# Patient Record
Sex: Male | Born: 1975 | Race: White | Hispanic: No | Marital: Married
Health system: Southern US, Community
[De-identification: ages and names within clinical notes are randomized; demographics above are authoritative.]

## PROBLEM LIST (undated history)

## (undated) DIAGNOSIS — I509 Heart failure, unspecified: Secondary | ICD-10-CM

---

## 2017-02-09 ENCOUNTER — Encounter: Payer: Self-pay | Admitting: Emergency Medicine

## 2017-02-09 ENCOUNTER — Observation Stay
Admit: 2017-02-09 | Discharge: 2017-02-09 | Disposition: A | Discharge status: Home / Self Care | Payer: Commercial Managed Care - PPO | Attending: Internal Medicine | Admitting: Internal Medicine

## 2017-02-09 ENCOUNTER — Observation Stay
Admission: EM | Admit: 2017-02-09 | Discharge: 2017-02-10 | Disposition: A | Discharge status: Home / Self Care | Payer: Commercial Managed Care - PPO | Attending: Internal Medicine | Admitting: Internal Medicine

## 2017-02-09 ENCOUNTER — Emergency Department: Payer: Commercial Managed Care - PPO

## 2017-02-09 ENCOUNTER — Other Ambulatory Visit: Payer: Self-pay

## 2017-02-09 DIAGNOSIS — I340 Nonrheumatic mitral (valve) insufficiency: Secondary | ICD-10-CM | POA: Diagnosis not present

## 2017-02-09 DIAGNOSIS — I4891 Unspecified atrial fibrillation: Principal | ICD-10-CM | POA: Insufficient documentation

## 2017-02-09 DIAGNOSIS — I5022 Chronic systolic (congestive) heart failure: Secondary | ICD-10-CM | POA: Insufficient documentation

## 2017-02-09 DIAGNOSIS — E669 Obesity, unspecified: Secondary | ICD-10-CM | POA: Insufficient documentation

## 2017-02-09 DIAGNOSIS — I110 Hypertensive heart disease with heart failure: Secondary | ICD-10-CM | POA: Insufficient documentation

## 2017-02-09 DIAGNOSIS — E782 Mixed hyperlipidemia: Secondary | ICD-10-CM | POA: Insufficient documentation

## 2017-02-09 DIAGNOSIS — Z79899 Other long term (current) drug therapy: Secondary | ICD-10-CM | POA: Diagnosis not present

## 2017-02-09 DIAGNOSIS — I426 Alcoholic cardiomyopathy: Secondary | ICD-10-CM | POA: Diagnosis not present

## 2017-02-09 DIAGNOSIS — G4733 Obstructive sleep apnea (adult) (pediatric): Secondary | ICD-10-CM | POA: Diagnosis not present

## 2017-02-09 DIAGNOSIS — Z6841 Body Mass Index (BMI) 40.0 and over, adult: Secondary | ICD-10-CM | POA: Diagnosis not present

## 2017-02-09 DIAGNOSIS — I420 Dilated cardiomyopathy: Secondary | ICD-10-CM | POA: Diagnosis not present

## 2017-02-09 DIAGNOSIS — R079 Chest pain, unspecified: Secondary | ICD-10-CM | POA: Diagnosis present

## 2017-02-09 DIAGNOSIS — I959 Hypotension, unspecified: Secondary | ICD-10-CM | POA: Insufficient documentation

## 2017-02-09 DIAGNOSIS — I493 Ventricular premature depolarization: Secondary | ICD-10-CM | POA: Insufficient documentation

## 2017-02-09 HISTORY — DX: Heart failure, unspecified: I50.9

## 2017-02-09 LAB — CBC
HCT: 46.5 % (ref 40.0–52.0)
Hemoglobin: 15.7 g/dL (ref 13.0–18.0)
MCH: 29 pg (ref 26.0–34.0)
MCHC: 33.6 g/dL (ref 32.0–36.0)
MCV: 86.2 fL (ref 80.0–100.0)
PLATELETS: 297 10*3/uL (ref 150–440)
RBC: 5.4 MIL/uL (ref 4.40–5.90)
RDW: 13.2 % (ref 11.5–14.5)
WBC: 6.8 10*3/uL (ref 3.8–10.6)

## 2017-02-09 LAB — COMPREHENSIVE METABOLIC PANEL
ALT: 27 U/L (ref 17–63)
ANION GAP: 9 (ref 5–15)
AST: 26 U/L (ref 15–41)
Albumin: 4.6 g/dL (ref 3.5–5.0)
Alkaline Phosphatase: 71 U/L (ref 38–126)
BUN: 18 mg/dL (ref 6–20)
CHLORIDE: 106 mmol/L (ref 101–111)
CO2: 25 mmol/L (ref 22–32)
CREATININE: 0.73 mg/dL (ref 0.61–1.24)
Calcium: 9.8 mg/dL (ref 8.9–10.3)
Glucose, Bld: 98 mg/dL (ref 65–99)
Potassium: 4 mmol/L (ref 3.5–5.1)
SODIUM: 140 mmol/L (ref 135–145)
Total Bilirubin: 1.3 mg/dL — ABNORMAL HIGH (ref 0.3–1.2)
Total Protein: 8 g/dL (ref 6.5–8.1)

## 2017-02-09 LAB — URINE DRUG SCREEN, QUALITATIVE (ARMC ONLY)
Amphetamines, Ur Screen: NOT DETECTED
BARBITURATES, UR SCREEN: NOT DETECTED
Benzodiazepine, Ur Scrn: NOT DETECTED
Cannabinoid 50 Ng, Ur ~~LOC~~: NOT DETECTED
Cocaine Metabolite,Ur ~~LOC~~: NOT DETECTED
MDMA (ECSTASY) UR SCREEN: NOT DETECTED
METHADONE SCREEN, URINE: NOT DETECTED
Opiate, Ur Screen: NOT DETECTED
Phencyclidine (PCP) Ur S: NOT DETECTED
TRICYCLIC, UR SCREEN: NOT DETECTED

## 2017-02-09 LAB — TROPONIN I: Troponin I: <0.03 ng/mL (ref <0.03)

## 2017-02-09 LAB — ETHANOL

## 2017-02-09 LAB — TSH: TSH: 0.925 u[IU]/mL (ref 0.350–4.500)

## 2017-02-09 LAB — APTT: APTT: 28 s (ref 24–36)

## 2017-02-09 LAB — BRAIN NATRIURETIC PEPTIDE: B NATRIURETIC PEPTIDE 5: 193 pg/mL — AB (ref 0.0–100.0)

## 2017-02-09 MED ORDER — ENOXAPARIN SODIUM 40 MG/0.4ML ~~LOC~~ SOLN
40.0000 mg | SUBCUTANEOUS | Status: DC
  Administered 2017-02-09: 40 mg via SUBCUTANEOUS
  Filled 2017-02-09: qty 0.4

## 2017-02-09 MED ORDER — ESCITALOPRAM OXALATE 10 MG PO TABS
20.0000 mg | ORAL_TABLET | Freq: Every day | ORAL | Status: DC
  Administered 2017-02-09: 20 mg via ORAL
  Filled 2017-02-09: qty 1

## 2017-02-09 MED ORDER — SODIUM CHLORIDE 0.9 % IV SOLN
Freq: Once | INTRAVENOUS | Status: AC
  Administered 2017-02-09: 08:00:00 via INTRAVENOUS

## 2017-02-09 MED ORDER — ETODOLAC 200 MG PO CAPS
400.0000 mg | ORAL_CAPSULE | Freq: Two times a day (BID) | ORAL | Status: DC
  Administered 2017-02-09 – 2017-02-10 (×3): 400 mg via ORAL
  Filled 2017-02-09 (×3): qty 2

## 2017-02-09 MED ORDER — ONDANSETRON HCL 4 MG/2ML IJ SOLN: 4.0000 mg | Freq: Four times a day (QID) | INTRAMUSCULAR | Status: DC | PRN

## 2017-02-09 MED ORDER — ACETAMINOPHEN 325 MG PO TABS: 650.0000 mg | ORAL_TABLET | ORAL | Status: DC | PRN

## 2017-02-09 MED ORDER — DILTIAZEM HCL 25 MG/5ML IV SOLN
20.0000 mg | Freq: Once | INTRAVENOUS | Status: AC
  Administered 2017-02-09: 20 mg via INTRAVENOUS
  Filled 2017-02-09: qty 5

## 2017-02-09 MED ORDER — NITROGLYCERIN 0.4 MG SL SUBL: 0.4000 mg | SUBLINGUAL_TABLET | SUBLINGUAL | Status: DC | PRN

## 2017-02-09 MED ORDER — ASPIRIN EC 325 MG PO TBEC
325.0000 mg | DELAYED_RELEASE_TABLET | Freq: Every day | ORAL | Status: DC
  Administered 2017-02-09 – 2017-02-10 (×2): 325 mg via ORAL
  Filled 2017-02-09 (×2): qty 1

## 2017-02-09 MED ORDER — PERFLUTREN LIPID MICROSPHERE
1.0000 mL | INTRAVENOUS | Status: AC | PRN
  Administered 2017-02-09: 1 mL via INTRAVENOUS
  Filled 2017-02-09: qty 2

## 2017-02-09 MED ORDER — SODIUM CHLORIDE 0.9 % IV SOLN
INTRAVENOUS | Status: DC
  Administered 2017-02-09 – 2017-02-10 (×2): via INTRAVENOUS

## 2017-02-09 MED ORDER — ASPIRIN 81 MG PO CHEW
324.0000 mg | CHEWABLE_TABLET | Freq: Once | ORAL | Status: AC
  Administered 2017-02-09: 324 mg via ORAL
  Filled 2017-02-09: qty 4

## 2017-02-09 NOTE — ED Triage Notes (Signed)
Pt to ed via ems from with reports of chest pain, diaphoresis started this morning. Also reports shortness of breath.

## 2017-02-09 NOTE — ED Provider Notes (Signed)
[facility] Emergency Department Provider Note       Time seen: ----------------------------------------- 7:35 AM on 02/09/2017 -----------------------------------------   I have reviewed the triage vital signs and the nursing notes.  HISTORY   Chief Complaint Chest Pain; Shortness of Breath; and Dizziness    HPI Louis Nelson is a 42 y.o. male with a history of CHF who presents to the ED for chest pain and diaphoresis that started this morning.  He also reports shortness of breath.  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  He was found to be in atrial fibrillation on arrival here.  He has never had a history of arrhythmia in the past.  Patient reports he had some sinus symptoms last week and had taken antibiotics but is not currently taking any over-the-counter medicines.  He denies any alcohol use.  Past Medical History:  Diagnosis Date  . CHF (congestive heart failure) (HCC)     There are no active problems to display for this patient.   Allergies Patient has no known allergies.  Social History Social History   Tobacco Use  . Smoking status: Not on file  Substance Use Topics  . Alcohol use: Not on file  . Drug use: Not on file   Review of Systems Constitutional: Negative for fever. Eyes: Negative for vision changes ENT: Positive for recent congestion Cardiovascular: Positive for chest pain Respiratory: Positive for shortness of breath Gastrointestinal: Negative for abdominal pain, vomiting and diarrhea. Genitourinary: Negative for dysuria. Musculoskeletal: Negative for back pain. Skin: Positive for diaphoresis Neurological: Negative for headaches, focal weakness or numbness.  All systems negative/normal/unremarkable except as stated in the HPI  ____________________________________________   PHYSICAL EXAM:  VITAL SIGNS: ED Triage Vitals [02/09/17 0719]  Enc Vitals Group     BP  125/70     Pulse Rate (!) 125     Resp (!) 22     Temp 98.7 F (37.1 C)     Temp Source Oral     SpO2 92 %     Weight 279 lb (126.6 kg)     Height      Head Circumference      Peak Flow      Pain Score 4     Pain Loc      Pain Edu?      Excl. in GC?    Constitutional: Alert and oriented. Well appearing and in no distress. Eyes: Conjunctivae are normal. Normal extraocular movements. ENT   Head: Normocephalic and atraumatic.   Nose: No congestion/rhinnorhea.   Mouth/Throat: Mucous membranes are moist.   Neck: No stridor. Cardiovascular: Rapid rate, irregular rhythm. No murmurs, rubs, or gallops. Respiratory: Normal respiratory effort without tachypnea nor retractions. Breath sounds are clear and equal bilaterally. No wheezes/rales/rhonchi. Gastrointestinal: Soft and nontender. Normal bowel sounds Musculoskeletal: Nontender with normal range of motion in extremities. No lower extremity tenderness nor edema. Neurologic:  Normal speech and language. No gross focal neurologic deficits are appreciated.  Skin:  Skin is warm, dry and intact. No rash noted. Psychiatric: Mood and affect are normal. Speech and behavior are normal.  ____________________________________________  EKG: Interpreted by me.  Atrial fibrillation with a rapid ventricular response, rate is 125 bpm, normal QRS size, normal QT.  ____________________________________________  ED COURSE:  As part of my medical decision making, I reviewed the following data within the electronic MEDICAL RECORD NUMBER History obtained from family if available, nursing notes, old chart  and ekg, as well as notes from prior ED visits. Patient presented for chest pain and was found to be in new onset atrial fibrillation, we will assess with labs and imaging as indicated at this time.   Procedures ____________________________________________   LABS (pertinent positives/negatives)  Labs Reviewed  COMPREHENSIVE METABOLIC PANEL -  Abnormal; Notable for the following components:      Result Value   Total Bilirubin 1.3 (*)    All other components within normal limits  CBC  TROPONIN I  TSH  URINE DRUG SCREEN, QUALITATIVE ([facility] ONLY)  BRAIN NATRIURETIC PEPTIDE  APTT   CRITICAL CARE Performed by: Emily FilbertWilliams, Elye Harmsen E   Total critical care time: 30 minutes  Critical care time was exclusive of separately billable procedures and treating other patients.  Critical care was necessary to treat or prevent imminent or life-threatening deterioration.  Critical care was time spent personally by me on the following activities: development of treatment plan with patient and/or surrogate as well as nursing, discussions with consultants, evaluation of patient's response to treatment, examination of patient, obtaining history from patient or surrogate, ordering and performing treatments and interventions, ordering and review of laboratory studies, ordering and review of radiographic studies, pulse oximetry and re-evaluation of patient's condition.  RADIOLOGY Images were viewed by me  Chest x-ray  IMPRESSION: No active disease. ____________________________________________  DIFFERENTIAL DIAGNOSIS   A. fib, MI, unstable angina, PE, pneumothorax, CHF  FINAL ASSESSMENT AND PLAN  Atrial fibrillation with a rapid ventricular response, chest pain   Plan: Patient had presented for chest pain and found to be in new onset A. fib. Patient's labs are reassuring. Patient's imaging were also reassuring.  No clear etiology for his sudden onset of chest pain and diaphoresis.  He will likely need to be admitted for new onset A. fib as well as this chest pain today.  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  He will likely need an echocardiogram.  Currently he is rate controlled.   Emily FilbertWilliams, Brendin Situ E, MD   Note: This note was generated in part or whole with voice recognition  software. Voice recognition is usually quite accurate but there are transcription errors that can and very often do occur. I apologize for any typographical errors that were not detected and corrected.     Emily FilbertWilliams, Alanee Ting E, MD 02/09/17 [phone]

## 2017-02-09 NOTE — H&P (Signed)
[facility] - [place] at [facility]   PATIENT NAME: Louis Nelson    MR#:  [id number]  DATE OF BIRTH:  [birth year]  DATE OF ADMISSION:  02/09/2017  PRIMARY CARE PHYSICIAN: Patient, No Pcp Per   REQUESTING/REFERRING PHYSICIAN: Emily Filbert, MD  CHIEF COMPLAINT:  Chest pain, shortness of breath and dizziness  HISTORY OF PRESENT ILLNESS:  Louis Nelson  is a 42 y.o. male with a known history of congestive heart failure, alcoholic cardiomyopathy, works as a Naval architect to deliver milk, is presenting to the ED with a chief complaint of chest pain with diaphoresis, shortness of breath and dizziness started this morning.  Patient came into the ED, EKG has revealed A. fib with RVR.  Reports last alcohol intake was around Christmastime.  Patient was given 20 mg of IV Cardizem and during my examination rate was controlled though he was still in atrial fibrillation.  Patient was  reporting chest pain in left axillary area,  hurts with movements.  Wife at bedside  PAST MEDICAL HISTORY:   Past Medical History:  Diagnosis Date  . CHF (congestive heart failure) (HCC)     PAST SURGICAL HISTOIRY:  Tonsillectomy and lymph node dissection  SOCIAL HISTORY:   Social History   Tobacco Use  . Smoking status: Not on file  Substance Use Topics  . Alcohol use: Not on file    FAMILY HISTORY:  No family history on file.  DRUG ALLERGIES:  No Known Allergies  REVIEW OF SYSTEMS:  CONSTITUTIONAL: No fever, fatigue or weakness.  EYES: No blurred or double vision.  EARS, NOSE, AND THROAT: No tinnitus or ear pain.  RESPIRATORY: No cough, shortness of breath, wheezing or hemoptysis.  CARDIOVASCULAR: Reporting left axillary chest pain, denies orthopnea, edema.  GASTROINTESTINAL: No nausea, vomiting, diarrhea or abdominal pain.  GENITOURINARY: No dysuria, hematuria.  ENDOCRINE: No polyuria, nocturia,  HEMATOLOGY: No anemia, easy bruising or bleeding SKIN: No rash or  lesion. MUSCULOSKELETAL: No joint pain or arthritis.   NEUROLOGIC: No tingling, numbness, weakness.  PSYCHIATRY: No anxiety or depression.   MEDICATIONS AT HOME:   Prior to Admission medications   Medication Sig Start Date End Date Taking? Authorizing Provider  carvedilol (COREG) 6.25 MG tablet Take 6.25 mg by mouth 2 (two) times daily with a meal.   Yes [provider]  escitalopram (LEXAPRO) 20 MG tablet Take 20 mg by mouth at bedtime.   Yes [provider]  etodolac (LODINE) 400 MG tablet Take 400 mg by mouth 2 (two) times daily.   Yes [provider]  furosemide (LASIX) 20 MG tablet Take 20 mg by mouth daily.   Yes [provider]  losartan (COZAAR) 50 MG tablet Take 50 mg by mouth daily.   Yes [provider]      VITAL SIGNS:  Blood pressure (!) 96/53, pulse 82, temperature 98.7 F (37.1 C), temperature source Oral, resp. rate 17, weight 126.6 kg (279 lb), SpO2 97 %.  PHYSICAL EXAMINATION:  GENERAL:  42 y.o.-year-old patient lying in the bed with no acute distress.  EYES: Pupils equal, round, reactive to light and accommodation. No scleral icterus. Extraocular muscles intact.  HEENT: Head atraumatic, normocephalic. Oropharynx and nasopharynx clear.  NECK:  Supple, no jugular venous distention. No thyroid enlargement, no tenderness.  LUNGS: Normal breath sounds bilaterally, no wheezing, rales,rhonchi or crepitation. No use of accessory muscles of respiration.  CARDIOVASCULAR: S1, S2 normal. No murmurs, rubs, or gallops.  Left axillary area  with  reproducible tenderness ABDOMEN: Soft, nontender, nondistended. Bowel sounds present. No organomegaly or mass.  EXTREMITIES: No pedal edema, cyanosis, or clubbing.  NEUROLOGIC: Cranial nerves II through XII are intact. Muscle strength 5/5 in all extremities. Sensation intact. Gait not checked.  PSYCHIATRIC: The patient is alert and oriented x 3.  SKIN: No obvious rash, lesion, or ulcer.    LABORATORY PANEL:   CBC Recent Labs  Lab 02/09/17 0735  WBC 6.8  HGB 15.7  HCT 46.5  PLT 297   ------------------------------------------------------------------------------------------------------------------  Chemistries  Recent Labs  Lab 02/09/17 0735  NA 140  K 4.0  CL 106  CO2 25  GLUCOSE 98  BUN 18  CREATININE 0.73  CALCIUM 9.8  AST 26  ALT 27  ALKPHOS 71  BILITOT 1.3*   ------------------------------------------------------------------------------------------------------------------  Cardiac Enzymes Recent Labs  Lab 02/09/17 0735  TROPONINI <0.03   ------------------------------------------------------------------------------------------------------------------  RADIOLOGY:  Dg Chest Port 1 View  Result Date: 02/09/2017 CLINICAL DATA:  Chest pain EXAM: PORTABLE CHEST 1 VIEW COMPARISON:  None. FINDINGS: Cardiomegaly. No focal opacities or effusions. No acute bony abnormality. IMPRESSION: No active disease. Electronically Signed   By: Charlett NoseKevin  Dover M.D.   On: 02/09/2017 08:23    EKG:   Orders placed or performed during the hospital encounter of 02/09/17  . EKG 12-Lead  . EKG 12-Lead  . ED EKG  . ED EKG    IMPRESSION AND PLAN:   Louis Nelson  is a 42 y.o. 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  Patient came into the ED, EKG has revealed A. fib with RVR.  Reports last alcohol intake was around Christmastime.  # New onset atrial fib with RVR Admit to tele Patient has received Cardizem 20 mg IV in the ED, currently rate controlled and hypotensive  [place] score is 2  We will start him on aspirin 325 mg and defer NOAC to cardiologist We will get an echocardiogram Hydrate with IV fluids  TSH is normal, chest x-ray negative Consult  cardiology-[facility](unassigned  urine drug screen negative  #Chest pain- Seems to be musculoskeletal as chest pain is reproducible  we will recycle cardiac biomarkers Monitor patient on telemetry Get echocardiogram Nitroglycerin sublingual as needed Cardiology consulted patient's primary cardiologist [place] in [place].   #History of alcoholic cardiomyopathy Will check serum alcohol level Patient being hypotensive will hold off on patient's home medication Coreg, Cozaar, Lasix at this point Patient reports his last drink was 2 weeks ago ciwa  #Chronic systolic class II CHF Currently not fluid overloaded Monitor daily weights, intake and output Continue aspirin Hold Lasix , Coreg and Cozaar in view of hypotension  #Essential hypertension Currently patient is hypotensive.  Hold off Coreg, Cozaar and Lasix  #Obesity Patient needs lifestyle modifications once clinically stable  DVT prophylaxis with Lovenox subcu   All the records are reviewed and case discussed with ED provider. Management plans discussed with the patient, family and they are in agreement.  CODE STATUS: fc , wife HCPOA  TOTAL TIME TAKING CARE OF THIS PATIENT: 43 minutes.   Note: This dictation was prepared with Dragon dictation along with smaller phrase technology. Any transcriptional errors that result from this process are unintentional.  Ramonita LabGouru, Quran Vasco M.D on 02/09/2017 at 11:00 AM  Between 7am to 6pm - Pager - [phone]  After 6pm go to [email or web address] - password [email or web address]  [facility]  Office  [phone]  CC: Primary care physician; Patient, No Pcp Per

## 2017-02-09 NOTE — Care Management (Signed)
Admitted with new onset atrial fib.  Required one dose IV cardizem in ED to control rate.  history of alcoholic cardiomyopathy. Cardiology consult is pending

## 2017-02-09 NOTE — Consult Note (Signed)
[facility] Cardiology Consultation Note  Patient ID: Louis Nelson, MRN: [id number], DOB/AGE: [birth year] 42 y.o. Admit date: 02/09/2017   Date of Consult: 02/09/2017 Primary Physician: Patient, No Pcp Per Primary Cardiologist: Sandre Kitty  Chief Complaint:  Chief Complaint  Patient presents with  . Chest Pain  . Shortness of Breath  . Dizziness   Reason for Consult: Shortness of breath  HPI: 42 y.o. male with known nonischemic dilated cardiomyopathy recently assessed by cardiac catheterization showing an ejection fraction of 30% and no evidence of previous myocardial infarction.  Patient was placed on appropriate medication management including carvedilol and furosemide.  The patient had done fairly well until recently and he had weakness and fatigue and tingling shortness of breath.  When seen in the emergency room at this time the patient did have atrial fibrillation with rapid ventricular rate for which is now better controlled at this time.  He has had no evidence of chest discomfort and current troponin suggest no evidence of myocardial infarction.  He needs further medication management and treatment options of this issue  Past Medical History:  Diagnosis Date  . CHF (congestive heart failure) (HCC)       Surgical History   Home Meds: Prior to Admission medications   Medication Sig Start Date End Date Taking? Authorizing Provider  carvedilol (COREG) 6.25 MG tablet Take 6.25 mg by mouth 2 (two) times daily with a meal.   Yes [provider]  escitalopram (LEXAPRO) 20 MG tablet Take 20 mg by mouth at bedtime.   Yes [provider]  etodolac (LODINE) 400 MG tablet Take 400 mg by mouth 2 (two) times daily.   Yes [provider]  furosemide (LASIX) 20 MG tablet Take 20 mg by mouth daily.   Yes [provider]  losartan (COZAAR) 50 MG tablet Take 50 mg by mouth daily.   Yes [provider]    Inpatient Medications:  . aspirin EC  325  mg Oral Daily  . enoxaparin (LOVENOX) injection  40 mg Subcutaneous Q24H  . escitalopram  20 mg Oral QHS  . etodolac  400 mg Oral BID   . sodium chloride 75 mL/hr at 02/09/17 1236    Allergies: No Known Allergies  Social History   Socioeconomic History  . Marital status: Married    Spouse name: Not on file  . Number of children: Not on file  . Years of education: Not on file  . Highest education level: Not on file  Social Needs  . Financial resource strain: Not on file  . Food insecurity - worry: Not on file  . Food insecurity - inability: Not on file  . Transportation needs - medical: Not on file  . Transportation needs - non-medical: Not on file  Occupational History  . Not on file  Tobacco Use  . Smoking status: Not on file  Substance and Sexual Activity  . Alcohol use: Not on file  . Drug use: Not on file  . Sexual activity: Not on file  Other Topics Concern  . Not on file  Social History Narrative  . Not on file     No family history on file.   Review of Systems Positive for shortness of breath weakness fatigue tingly Negative for: General:  chills, fever, night sweats or weight changes.  Cardiovascular: PND orthopnea syncope dizziness  Dermatological skin lesions rashes Respiratory: Cough congestion Urologic: Frequent urination urination at night and hematuria Abdominal: negative for nausea, vomiting, diarrhea, bright red blood  per rectum, melena, or hematemesis Neurologic: negative for visual changes, and/or hearing changes  All other systems reviewed and are otherwise negative except as noted above.  Labs: Recent Labs    02/09/17 0735 02/09/17 1136  TROPONINI <0.03 <0.03   Lab Results  Component Value Date   WBC 6.8 02/09/2017   HGB 15.7 02/09/2017   HCT 46.5 02/09/2017   MCV 86.2 02/09/2017   PLT 297 02/09/2017    Recent Labs  Lab 02/09/17 0735  NA 140  K 4.0  CL 106  CO2 25  BUN 18  CREATININE 0.73  CALCIUM 9.8  PROT 8.0  BILITOT  1.3*  ALKPHOS 71  ALT 27  AST 26  GLUCOSE 98   No results found for: CHOL, HDL, LDLCALC, TRIG No results found for: DDIMER  Radiology/Studies:  Dg Chest Port 1 View  Result Date: 02/09/2017 CLINICAL DATA:  Chest pain EXAM: PORTABLE CHEST 1 VIEW COMPARISON:  None. FINDINGS: Cardiomegaly. No focal opacities or effusions. No acute bony abnormality. IMPRESSION: No active disease. Electronically Signed   By: Charlett NoseKevin  Dover M.D.   On: 02/09/2017 08:23    EKG: Atrial fibrillation with rapid ventricular rate  Weights: Filed Weights   02/09/17 0719  Weight: 126.6 kg (279 lb)     Physical Exam: Blood pressure 126/83, pulse (!) 108, temperature 97.7 F (36.5 C), temperature source Oral, resp. rate 18, weight 126.6 kg (279 lb), SpO2 93 %. There is no height or weight on file to calculate BMI. General: Well developed, well nourished, in no acute distress. Head eyes ears nose throat: Normocephalic, atraumatic, sclera non-icteric, no xanthomas, nares are without discharge. No apparent thyromegaly and/or mass  Lungs: Normal respiratory effort.  no wheezes, no rales, no rhonchi.  Heart: Irregular with normal S1 S2. no murmur gallop, no rub, PMI is normal size and placement, carotid upstroke normal without bruit, jugular venous pressure is normal Abdomen: Soft, non-tender, non-distended with normoactive bowel sounds. No hepatomegaly. No rebound/guarding. No obvious abdominal masses. Abdominal aorta is normal size without bruit Extremities: No edema. no cyanosis, no clubbing, no ulcers  Peripheral : 2+ bilateral upper extremity pulses, 2+ bilateral femoral pulses, 2+ bilateral dorsal pedal pulse Neuro: Alert and oriented. No facial asymmetry. No focal deficit. Moves all extremities spontaneously. Musculoskeletal: Normal muscle tone without kyphosis Psych:  Responds to questions appropriately with a normal affect.    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  Further consideration of changing carvedilol to metoprolol better heart rate control of atrial fibrillation 2.  Consider  anticoagulation for further risk reduction and stroke with atrial fibrillation 3.  Echocardiogram for further LV systolic dysfunction valvular heart changes 4.  Continue furosemide for further risk reduction in lower extremity edema  Signed, Lamar BlinksBruce J Dondrea Clendenin M.D. [facility] 02/09/2017, 1:00 PM

## 2017-02-10 LAB — BASIC METABOLIC PANEL
Anion gap: 6 (ref 5–15)
BUN: 15 mg/dL (ref 6–20)
CALCIUM: 8.6 mg/dL — AB (ref 8.9–10.3)
CO2: 26 mmol/L (ref 22–32)
CREATININE: 0.73 mg/dL (ref 0.61–1.24)
Chloride: 109 mmol/L (ref 101–111)
Glucose, Bld: 112 mg/dL — ABNORMAL HIGH (ref 65–99)
Potassium: 3.8 mmol/L (ref 3.5–5.1)
SODIUM: 141 mmol/L (ref 135–145)

## 2017-02-10 LAB — LIPID PANEL
CHOL/HDL RATIO: 5.6 ratio
Cholesterol: 218 mg/dL — ABNORMAL HIGH (ref 0–200)
HDL: 39 mg/dL — AB (ref >40)
LDL CALC: 161 mg/dL — AB (ref 0–99)
Triglycerides: 88 mg/dL (ref <150)
VLDL: 18 mg/dL (ref 0–40)

## 2017-02-10 LAB — ECHOCARDIOGRAM COMPLETE: Weight: 4464 oz

## 2017-02-10 LAB — HIV ANTIBODY (ROUTINE TESTING W REFLEX): HIV Screen 4th Generation wRfx: NONREACTIVE

## 2017-02-10 LAB — TROPONIN I

## 2017-02-10 MED ORDER — LOSARTAN POTASSIUM 50 MG PO TABS: 50.0000 mg | ORAL_TABLET | Freq: Every day | ORAL | Status: DC

## 2017-02-10 MED ORDER — CARVEDILOL 6.25 MG PO TABS: 6.2500 mg | ORAL_TABLET | Freq: Two times a day (BID) | ORAL | Status: DC

## 2017-02-10 MED ORDER — FUROSEMIDE 20 MG PO TABS
20.0000 mg | ORAL_TABLET | Freq: Every day | ORAL | Status: DC
  Administered 2017-02-10: 20 mg via ORAL
  Filled 2017-02-10: qty 1

## 2017-02-10 NOTE — Plan of Care (Signed)
  Progressing Clinical Measurements: Cardiovascular complication will be avoided 02/10/2017 0006 - Progressing by Stefan Churchogers, Lyndzie Zentz M, RN Education: Knowledge of disease or condition will improve 02/10/2017 0006 - Progressing by Stefan Churchogers, Alfie Rideaux M, RN Understanding of medication regimen will improve 02/10/2017 0006 - Progressing by Stefan Churchogers, Jesiah Grismer M, RN Cardiac: Ability to achieve and maintain adequate cardiopulmonary perfusion will improve 02/10/2017 0006 - Progressing by Stefan Churchogers, Homer Pfeifer M, RN

## 2017-02-10 NOTE — Discharge Summary (Signed)
[facility] [place] [facility]   PATIENT NAME: Louis Nelson    MR#:  [id number]  DATE OF BIRTH:  05/23/1975  DATE OF ADMISSION:  02/09/2017 ADMITTING PHYSICIAN: Ramonita LabAruna Gouru, MD  DATE OF DISCHARGE: 02/10/2017  PRIMARY CARE PHYSICIAN: Patient, No Pcp Per    ADMISSION DIAGNOSIS:  Atrial fibrillation with rapid ventricular response (HCC) [I48.91] Chest pain, unspecified type [R07.9]  DISCHARGE DIAGNOSIS:  A. fib with RVR--- transient now resolved Non ischemic cardiomyopathy OSA  SECONDARY DIAGNOSIS:   Past Medical History:  Diagnosis Date  . CHF (congestive heart failure) [facility](HCC)     HOSPITAL COURSE:   Louis Munroatrick Thinnes  is a [age] y.o. 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  Patient came into the ED, EKG has revealed A. fib with RVR.  Reports last alcohol intake was around Christmastime.  # New onset atrial fib with RVR--now in SR with occ PVC Patient has received Cardizem 20 mg IV in the ED, currently rate controlled and hypotensive  [place] score is 2  Pending echocardiogram  TSH is normal, chest x-ray negative Consult cardiology-[facility] Appreciate input.  Okay to discharge from cardiology standpoint.  Urine drug screen negative  #Chest pain- resolve Seems to be musculoskeletal as chest pain is reproducible  CE negative Nitroglycerin sublingual as needed Cardiology consulted patient's primary cardiologist [place] located in [place]--patient will follow up on January 23 his scheduled appointment  #History of alcoholic cardiomyopathy Patient reports his last drink was 2 weeks ago ciwa--normal  #Chronic systolic class III CHF Currently not fluid overloaded Monitor daily weights, intake and output Continue aspirin Resume Lasix , Coreg and  Cozaar  #Essential hypertension Stable now  #Obesity/obstructive sleep apnea Patient needs lifestyle modifications  Continue CPAP at home  # DVT prophylaxis with Lovenox subcut  Discussed with patient and wife Will discharge home if okay with Dr. Gwen PoundsKowalski.  CONSULTS OBTAINED:  Treatment Team:  Lamar BlinksKowalski, Bruce J, MD  DRUG ALLERGIES:  No Known Allergies  DISCHARGE MEDICATIONS:   Allergies as of 02/10/2017   No Known Allergies     Medication List    TAKE these medications   carvedilol 6.25 MG tablet Commonly known as:  COREG Take 6.25 mg by mouth 2 (two) times daily with a meal.   escitalopram 20 MG tablet Commonly known as:  LEXAPRO Take 20 mg by mouth at bedtime.   etodolac 400 MG tablet Commonly known as:  LODINE Take 400 mg by mouth 2 (two) times daily.   furosemide 20 MG tablet Commonly known as:  LASIX Take 20 mg by mouth daily.   losartan 50 MG tablet Commonly known as:  COZAAR Take 50 mg by mouth daily.       If you experience worsening of your admission symptoms, develop shortness of breath, life threatening emergency, suicidal or homicidal thoughts you must seek medical attention immediately by calling 911 or calling your MD immediately  if symptoms less severe.  You Must read complete instructions/literature along with all the possible adverse reactions/side effects for all the Medicines you take and that have been prescribed to you. Take any new Medicines after you have completely understood and accept all the possible adverse reactions/side effects.   Please note  You were cared for by a hospitalist during your hospital stay. If  you have any questions about your discharge medications or the care you received while you were in the hospital after you are discharged, you can call the unit and asked to speak with the hospitalist on call if the hospitalist that took care of you is not available. Once you are discharged, your primary care physician  will handle any further medical issues. Please note that NO REFILLS for any discharge medications will be authorized once you are discharged, as it is imperative that you return to your primary care physician (or establish a relationship with a primary care physician if you do not have one) for your aftercare needs so that they can reassess your need for medications and monitor your lab values. Today   SUBJECTIVE   No new complaints  VITAL SIGNS:  Blood pressure (!) 124/59, pulse 72, temperature 97.8 F (36.6 C), resp. rate 18, height 5\' 9"  (1.753 m), weight 125.6 kg (276 lb 14.4 oz), SpO2 99 %.  I/O:    Intake/Output Summary (Last 24 hours) at 02/10/2017 1137 Last data filed at 02/10/2017 1020 Gross per 24 hour  Intake 1260 ml  Output 800 ml  Net 460 ml    PHYSICAL EXAMINATION:  GENERAL:  42 y.o.-year-old patient lying in the bed with no acute distress. obese EYES: Pupils equal, round, reactive to light and accommodation. No scleral icterus. Extraocular muscles intact.  HEENT: Head atraumatic, normocephalic. Oropharynx and nasopharynx clear.  NECK:  Supple, no jugular venous distention. No thyroid enlargement, no tenderness.  LUNGS: Normal breath sounds bilaterally, no wheezing, rales,rhonchi or crepitation. No use of accessory muscles of respiration.  CARDIOVASCULAR: S1, S2 normal. No murmurs, rubs, or gallops.  ABDOMEN: Soft, non-tender, non-distended. Bowel sounds present. No organomegaly or mass.  EXTREMITIES: No pedal edema, cyanosis, or clubbing.  NEUROLOGIC: Cranial nerves II through XII are intact. Muscle strength 5/5 in all extremities. Sensation intact. Gait not checked.  PSYCHIATRIC: The patient is alert and oriented x 3.  SKIN: No obvious rash, lesion, or ulcer.   DATA REVIEW:   CBC  Recent Labs  Lab 02/09/17 0735  WBC 6.8  HGB 15.7  HCT 46.5  PLT 297    Chemistries  Recent Labs  Lab 02/09/17 0735 02/10/17 0456  NA 140 141  K 4.0 3.8  CL 106 109  CO2  25 26  GLUCOSE 98 112*  BUN 18 15  CREATININE 0.73 0.73  CALCIUM 9.8 8.6*  AST 26  --   ALT 27  --   ALKPHOS 71  --   BILITOT 1.3*  --     Microbiology Results   No results found for this or any previous visit (from the past 240 hour(s)).  RADIOLOGY:  Dg Chest Port 1 View  Result Date: 02/09/2017 CLINICAL DATA:  Chest pain EXAM: PORTABLE CHEST 1 VIEW COMPARISON:  None. FINDINGS: Cardiomegaly. No focal opacities or effusions. No acute bony abnormality. IMPRESSION: No active disease. Electronically Signed   By: Charlett Nose M.D.   On: 02/09/2017 08:23     Management plans discussed with the patient, family and they are in agreement.  CODE STATUS:     Code Status Orders  (From admission, onward)        Start     Ordered   02/09/17 1126  Full code  Continuous     02/09/17 1125    Code Status History    Date Active Date Inactive Code Status Order ID Comments User Context   This patient has a current  code status but no historical code status.      TOTAL TIME TAKING CARE OF THIS PATIENT: *40* minutes.    Enedina Finner M.D on 02/10/2017 at 11:37 AM  Between 7am to 6pm - Pager - [phone] After 6pm go to [email or web address] - password [email or web address]  [facility]  Office  [phone]  CC: Primary care physician; Patient, No Pcp Per

## 2017-02-10 NOTE — Progress Notes (Signed)
[facility]  Patient: Louis Munroatrick Fuerstenberg / Admit Date: 02/09/2017 / Date of Encounter: 02/10/2017, 8:56 AM   Subjective: Patient feeling better today less palpitation shortness of breath.  Patient spontaneously converted to normal sinus rhythm overnight.  No evidence of myocardial infarction with normal troponin  Review of Systems: Positive for: Shortness of breath palpitations Negative for: Vision change, hearing change, syncope, dizziness, nausea, vomiting,diarrhea, bloody stool, stomach pain, cough, congestion, diaphoresis, urinary frequency, urinary pain,skin lesions, skin rashes Others previously listed  Objective: Telemetry: Normal sinus rhythm with preventricular contraction Physical Exam: Blood pressure (!) 124/59, pulse 72, temperature 97.8 F (36.6 C), resp. rate 18, height 5\' 9"  (1.753 m), weight 125.6 kg (276 lb 14.4 oz), SpO2 99 %. Body mass index is 40.89 kg/m. General: Well developed, well nourished, in no acute distress. Head: Normocephalic, atraumatic, sclera non-icteric, no xanthomas, nares are without discharge. Neck: No apparent masses Lungs: Normal respirations with no wheezes, no rhonchi, no rales , no crackles   Heart: irregular rate and rhythm, normal S1 S2, no murmur, no rub, no gallop, PMI is normal size and placement, carotid upstroke normal without bruit, jugular venous pressure normal Abdomen: Soft, non-tender, non-distended with normoactive bowel sounds. No hepatosplenomegaly. Abdominal aorta is normal size without bruit Extremities: No edema, no clubbing, no cyanosis, no ulcers,  Peripheral: 2+ radial, 2+ femoral, 2+ dorsal pedal pulses Neuro: Alert and oriented. Moves all extremities spontaneously. Psych:  Responds to questions appropriately with a normal affect.   Intake/Output Summary (Last 24 hours) at 02/10/2017 0856 Last data filed at 02/10/2017 0524 Gross per 24 hour  Intake 1780 ml  Output 800 ml  Net 980 ml     Inpatient Medications:  . aspirin EC  325 mg Oral Daily  . enoxaparin (LOVENOX) injection  40 mg Subcutaneous Q24H  . escitalopram  20 mg Oral QHS  . etodolac  400 mg Oral BID   Infusions:  . sodium chloride 75 mL/hr at 02/10/17 0231    Labs: Recent Labs    02/09/17 0735 02/10/17 0456  NA 140 141  K 4.0 3.8  CL 106 109  CO2 25 26  GLUCOSE 98 112*  BUN 18 15  CREATININE 0.73 0.73  CALCIUM 9.8 8.6*   Recent Labs    02/09/17 0735  AST 26  ALT 27  ALKPHOS 71  BILITOT 1.3*  PROT 8.0  ALBUMIN 4.6   Recent Labs    02/09/17 0735  WBC 6.8  HGB 15.7  HCT 46.5  MCV 86.2  PLT 297   Recent Labs    02/09/17 0735 02/09/17 1136 02/09/17 1742 02/09/17 2330  TROPONINI <0.03 <0.03 <0.03 <0.03   Invalid input(s): POCBNP No results for input(s): HGBA1C in the last 72 hours.   Weights: Filed Weights   02/09/17 0719 02/09/17 1357 02/10/17 0510  Weight: 126.6 kg (279 lb) 124.8 kg (275 lb 1.6 oz) 125.6 kg (276 lb 14.4 oz)     Radiology/Studies:  Dg Chest Port 1 View  Result Date: 02/09/2017 CLINICAL DATA:  Chest pain EXAM: PORTABLE CHEST 1 VIEW COMPARISON:  None. FINDINGS: Cardiomegaly. No focal opacities or effusions. No acute bony abnormality. IMPRESSION: No active disease. Electronically Signed   By: Charlett NoseKevin  Dover M.D.   On: 02/09/2017 08:23     Assessment and Recommendation  42 y.o. male with known dilated cardiomyopathy of unknown etiology with essential hypertension mixed hyperlipidemia recently placed on appropriate medication management for cardiomyopathy with some continued shortness of breath multifactorial in  nature including the possibility of sleep apnea and now having atrial fibrillation with rapid ventricular rate causing symptoms for admission without evidence of myocardial infarction 1.  Continue carvedilol for maintenance of normal sinus rhythm and ambulate and follow for improvements of symptoms and possible continued follow-up 2.  Detention  receptor blocker for hypertension control and treatment of cardiomyopathy 3.  Con sitter high intensity cholesterol therapy for significant hyperlipidemia 4.  Okay for discharge home if ambulating well with follow-up with his cardiologist in early next week for further treatment options of above  Signed, Arnoldo Hooker M.D. FACC

## 2017-02-10 NOTE — Progress Notes (Signed)
Nutrition Education Note  RD consulted for nutrition education regarding new onset CHF.  42 y.o.malewith a known history of congestive heart failure, alcoholic cardiomyopathy,works as a truck driver to deliver milk,is presenting to the ED with a chief complaint of chest pain with diaphoresis, shortness of breath and dizziness. Found to have A-Fib  RD provided "Low Sodium Nutrition Therapy" handout from the Academy of [employer]. Reviewed patient's dietary recall. Provided examples on ways to decrease sodium intake in diet. Discouraged intake of processed foods and use of salt shaker. Encouraged fresh fruits and vegetables as well as whole grain sources of carbohydrates to maximize fiber intake.   RD discussed why it is important for patient to adhere to diet recommendations, and emphasized the role of fluids, foods to avoid, and importance of weighing self daily. Teach back method used.  Expect fair compliance.  Body mass index is 40.89 kg/m. Pt meets criteria for morbid obesity based on current BMI.  Current diet order is HH, patient is consuming approximately 100% of meals at this time. Labs and medications reviewed. No further nutrition interventions warranted at this time. RD contact information provided. If additional nutrition issues arise, please re-consult RD.   Betsey Holidayasey Shamra Bradeen MS, RD, LDN Pager #- [phone] After Hours Pager: [phone]

## 2017-02-10 NOTE — Progress Notes (Signed)
Pt discharged home with follow up appointment instructions. VSS, no complaints.

## 2019-09-15 IMAGING — DX DG CHEST 1V PORT
1 series · 1 of 1 positions shown · non-contrast
Comparison: None.

CLINICAL DATA: Chest pain

EXAM:
PORTABLE CHEST 1 VIEW

[chest ap]
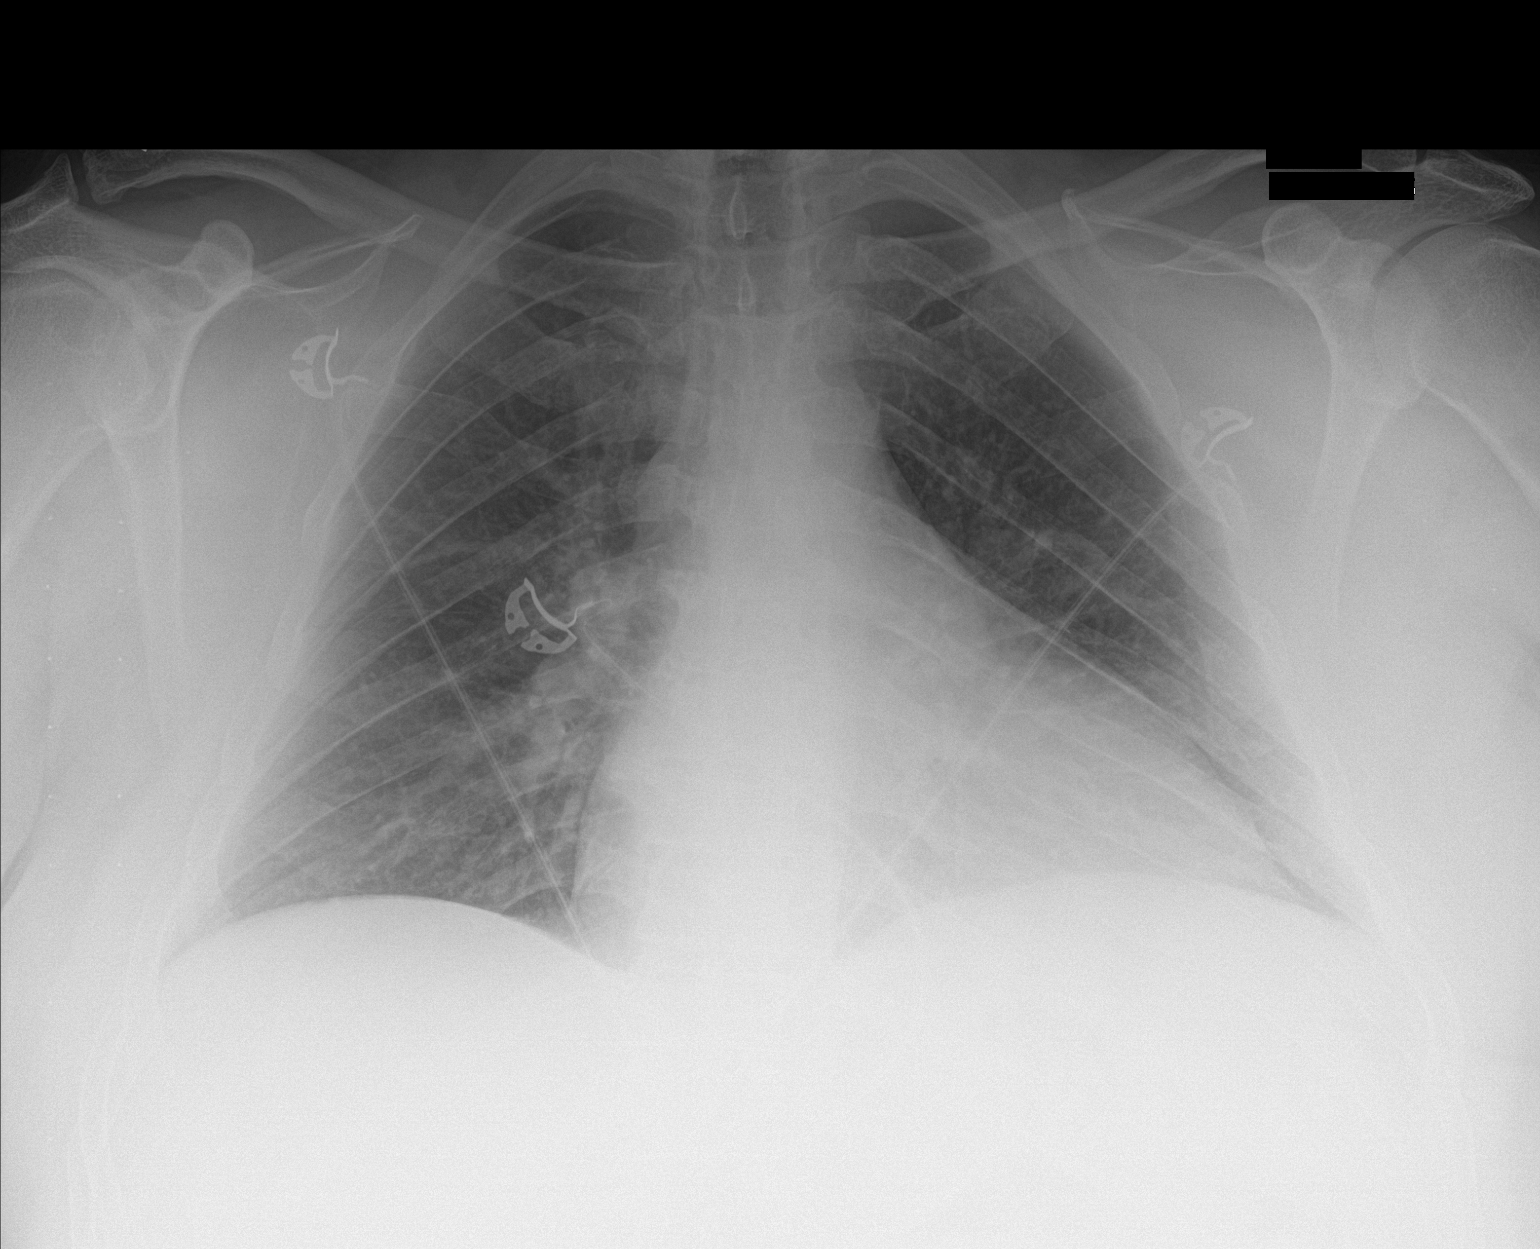

[1 of 1 positions shown; findings below may reference images not displayed]

FINDINGS: Cardiomegaly. No focal opacities or effusions. No acute bony
abnormality.
IMPRESSION: No active disease.
